# Patient Record
Sex: Female | Born: 1975 | Race: Black or African American | Hispanic: No | Marital: Single | Smoking: Current every day smoker
Health system: Southern US, Community
[De-identification: ages and names within clinical notes are randomized; demographics above are authoritative.]

## PROBLEM LIST (undated history)

## (undated) HISTORY — PX: DENTAL SURGERY: SHX609

---

## 2005-08-26 ENCOUNTER — Ambulatory Visit: Payer: Self-pay | Admitting: Internal Medicine

## 2005-08-26 ENCOUNTER — Ambulatory Visit: Payer: Self-pay | Admitting: *Deleted

## 2005-09-16 ENCOUNTER — Ambulatory Visit: Payer: Self-pay | Admitting: Internal Medicine

## 2010-12-26 ENCOUNTER — Emergency Department (HOSPITAL_COMMUNITY)
Admission: EM | Admit: 2010-12-26 | Discharge: 2010-12-26 | Disposition: A | Discharge status: Home / Self Care | Payer: Self-pay | Attending: Emergency Medicine | Admitting: Emergency Medicine

## 2010-12-26 DIAGNOSIS — IMO0002 Reserved for concepts with insufficient information to code with codable children: Secondary | ICD-10-CM | POA: Insufficient documentation

## 2010-12-26 DIAGNOSIS — H9190 Unspecified hearing loss, unspecified ear: Secondary | ICD-10-CM | POA: Insufficient documentation

## 2013-07-14 DIAGNOSIS — S2249XA Multiple fractures of ribs, unspecified side, initial encounter for closed fracture: Secondary | ICD-10-CM | POA: Insufficient documentation

## 2013-07-26 DIAGNOSIS — S270XXA Traumatic pneumothorax, initial encounter: Secondary | ICD-10-CM | POA: Insufficient documentation

## 2013-07-26 DIAGNOSIS — F17200 Nicotine dependence, unspecified, uncomplicated: Secondary | ICD-10-CM | POA: Insufficient documentation

## 2017-10-24 ENCOUNTER — Other Ambulatory Visit: Payer: Self-pay

## 2017-10-24 ENCOUNTER — Encounter (HOSPITAL_COMMUNITY): Payer: Self-pay | Admitting: Emergency Medicine

## 2017-10-24 ENCOUNTER — Emergency Department (HOSPITAL_COMMUNITY): Payer: Self-pay

## 2017-10-24 ENCOUNTER — Emergency Department (HOSPITAL_COMMUNITY)
Admission: EM | Admit: 2017-10-24 | Discharge: 2017-10-24 | Disposition: A | Discharge status: Home / Self Care | Payer: Self-pay | Attending: Emergency Medicine | Admitting: Emergency Medicine

## 2017-10-24 DIAGNOSIS — M79675 Pain in left toe(s): Secondary | ICD-10-CM | POA: Insufficient documentation

## 2017-10-24 DIAGNOSIS — F17210 Nicotine dependence, cigarettes, uncomplicated: Secondary | ICD-10-CM | POA: Insufficient documentation

## 2017-10-24 MED ORDER — IBUPROFEN 600 MG PO TABS: 600.0000 mg | ORAL_TABLET | Freq: Four times a day (QID) | ORAL | 0 refills | Status: AC | PRN

## 2017-10-24 MED ORDER — IBUPROFEN 800 MG PO TABS
800.0000 mg | ORAL_TABLET | Freq: Once | ORAL | Status: AC
  Administered 2017-10-24: 800 mg via ORAL
  Filled 2017-10-24: qty 1

## 2017-10-24 NOTE — ED Provider Notes (Signed)
[facility] EMERGENCY DEPARTMENT Provider Note   CSN: [id number] Arrival date & time: 10/24/17  2158     History   Chief Complaint Chief Complaint  Patient presents with  . Toe Pain    HPI Savannah Peters is a 42 y.o. female.  HPI   Savannah Peters is a 42 y.o. female who presents to the Emergency Department complaining of left great toe pain and "popping"  Symptoms have been present for one day.  She states that her toe pops when she walks and causes a sharp pain to radiate toward her ankle.  Tonight, she noticed a bruise to her ankle.  She denies known injury.  Admits to wearing sandals recently.  No numbness, ankle pain, discoloration of her toes.     History reviewed. No pertinent past medical history.  There are no active problems to display for this patient.   History reviewed. No pertinent surgical history.   OB History   None      Home Medications    Prior to Admission medications   Not on File    Family History History reviewed. No pertinent family history.  Social History Social History   Tobacco Use  . Smoking status: Current Every Day Smoker    Packs/day: 2.00  . Smokeless tobacco: Never Used  Substance Use Topics  . Alcohol use: Yes    Comment: q week  . Drug use: Never     Allergies   Patient has no known allergies.   Review of Systems Review of Systems  Constitutional: Negative for chills and fever.  Musculoskeletal: Positive for arthralgias (left great toe pain and "popping"). Negative for joint swelling.  Skin: Negative for color change and wound.  Neurological: Negative for weakness and numbness.  All other systems reviewed and are negative.    Physical Exam Updated Vital Signs BP 133/84 (BP Location: Left Arm)   Pulse 76   Temp 98 F (36.7 C) (Tympanic)   Resp 17   Ht 5\' 7"  (1.702 m)   Wt 60.8 kg (134 lb)   LMP 10/17/2017   SpO2 97%   BMI 20.99 kg/m   Physical Exam  Constitutional: She appears well-developed and  well-nourished. No distress.  HENT:  Head: Atraumatic.  Cardiovascular: Normal rate, regular rhythm and intact distal pulses.  Pulmonary/Chest: Effort normal and breath sounds normal.  Musculoskeletal: She exhibits tenderness. She exhibits no edema or deformity.  ttp of the MTP joint of the left great toe.  No edema, erythema or bony deformity.  Nail is nml appearing.  No tenderness proximal to the toe.  No obvious bruise of the ankle.  Neurological: She is alert. No sensory deficit.  Skin: Skin is warm and dry. Capillary refill takes less than 2 seconds. No rash noted.  Nursing note and vitals reviewed.    ED Treatments / Results  Labs (all labs ordered are listed, but only abnormal results are displayed) Labs Reviewed - No data to display  EKG None  Radiology Dg Foot Complete Left  Result Date: 10/24/2017 CLINICAL DATA:  42 year old female with left foot pain. EXAM: LEFT FOOT - COMPLETE 3+ VIEW COMPARISON:  None. FINDINGS: There is no evidence of fracture or dislocation. There is no evidence of arthropathy or other focal bone abnormality. Soft tissues are unremarkable. IMPRESSION: Negative. Electronically Signed   By: Elgie Collard M.D.   On: 10/24/2017 23:22    Procedures Procedures (including critical care time)  Medications Ordered in ED Medications - No data to  display   Initial Impression / Assessment and Plan / ED Course  I have reviewed the triage vital signs and the nursing notes.  Pertinent labs & imaging results that were available during my care of the patient were reviewed by me and considered in my medical decision making (see chart for details).     NV intact.  No skin changes on exam.  XR reassuring.   Pt agrees to ibuprofen, post op shoe applied for support.    Final Clinical Impressions(s) / ED Diagnoses   Final diagnoses:  Pain of left great toe    ED Discharge Orders    None       Rosey Bathriplett, Kipp Shank, PA-C 10/24/17 2350    Benjiman CorePickering,  Nathan, MD 10/24/17 2352

## 2017-10-24 NOTE — ED Triage Notes (Signed)
PT reports she woke up this morning and her L great toe kept "popping" with no injury. States pain has increased throughout the day. Tylenol at home without relief.

## 2017-10-24 NOTE — Discharge Instructions (Signed)
Wear the post op shoe as needed.  Call the podiatry provider listed to arrange a follow-up appt if needed.

## 2019-07-02 IMAGING — CR DG FOOT COMPLETE 3+V*L*
1 series · 3 of 3 positions shown · non-contrast
Comparison: None.

CLINICAL DATA: 41-year-old female with left foot pain.

EXAM:
LEFT FOOT - COMPLETE 3+ VIEW

[Series 1: ap · 0.17mm/px · 3 of 3 slices shown]
[im 1/3]
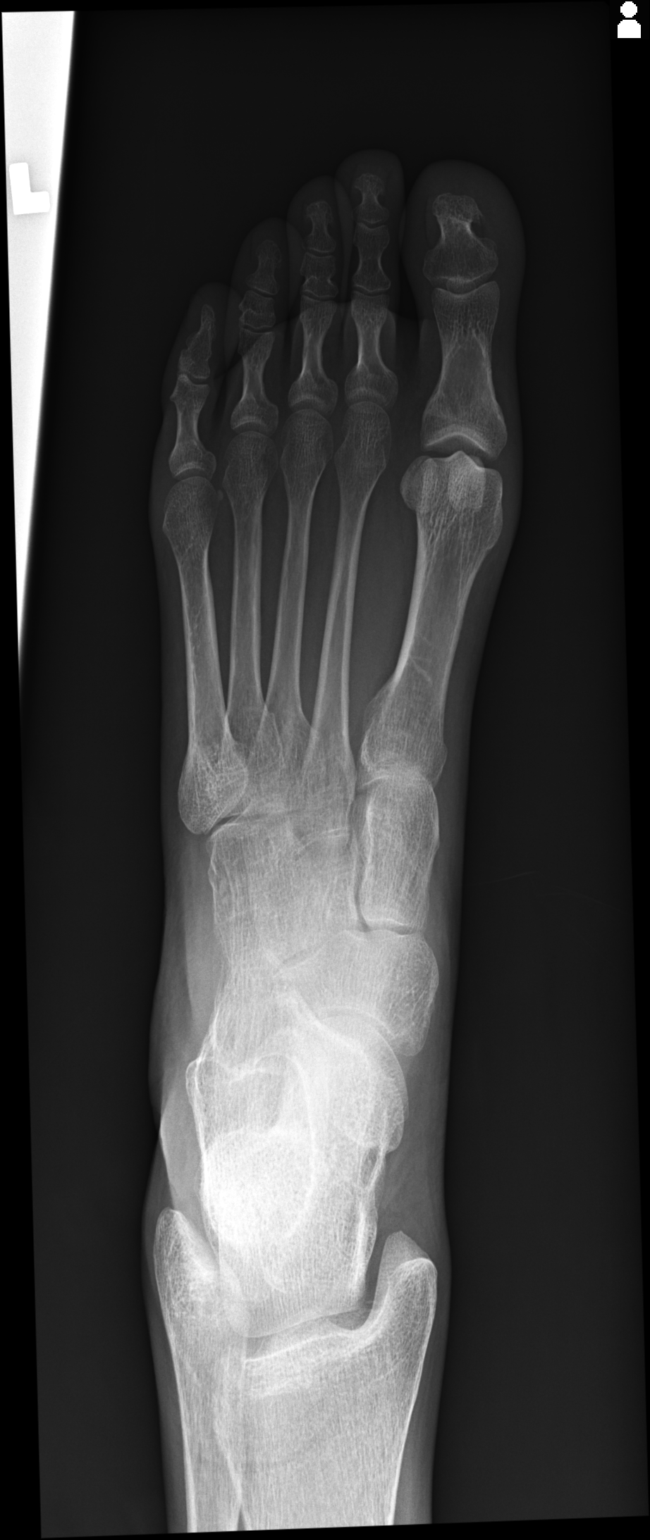
[im 2/3]
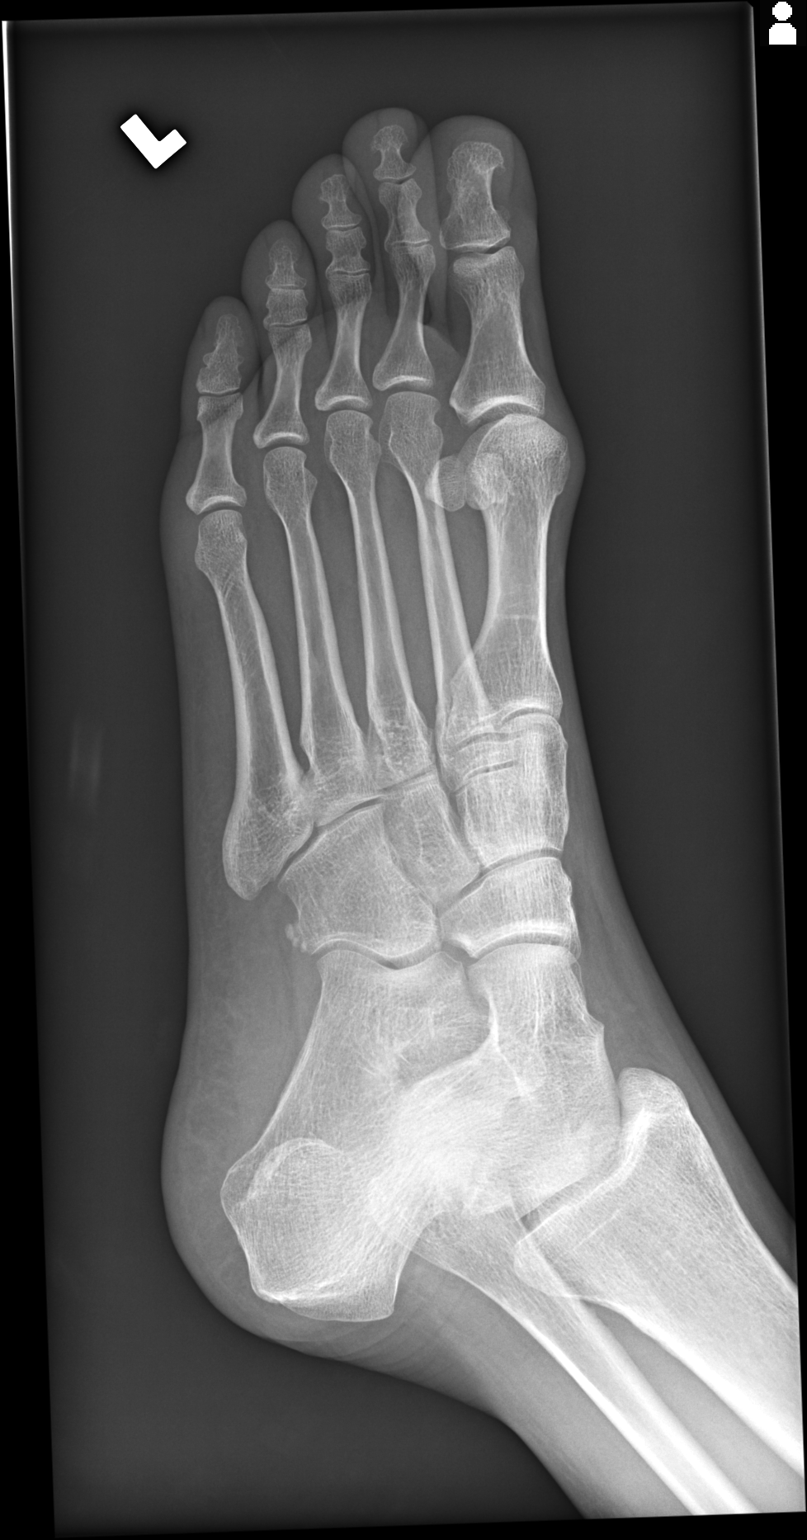
[im 3/3]
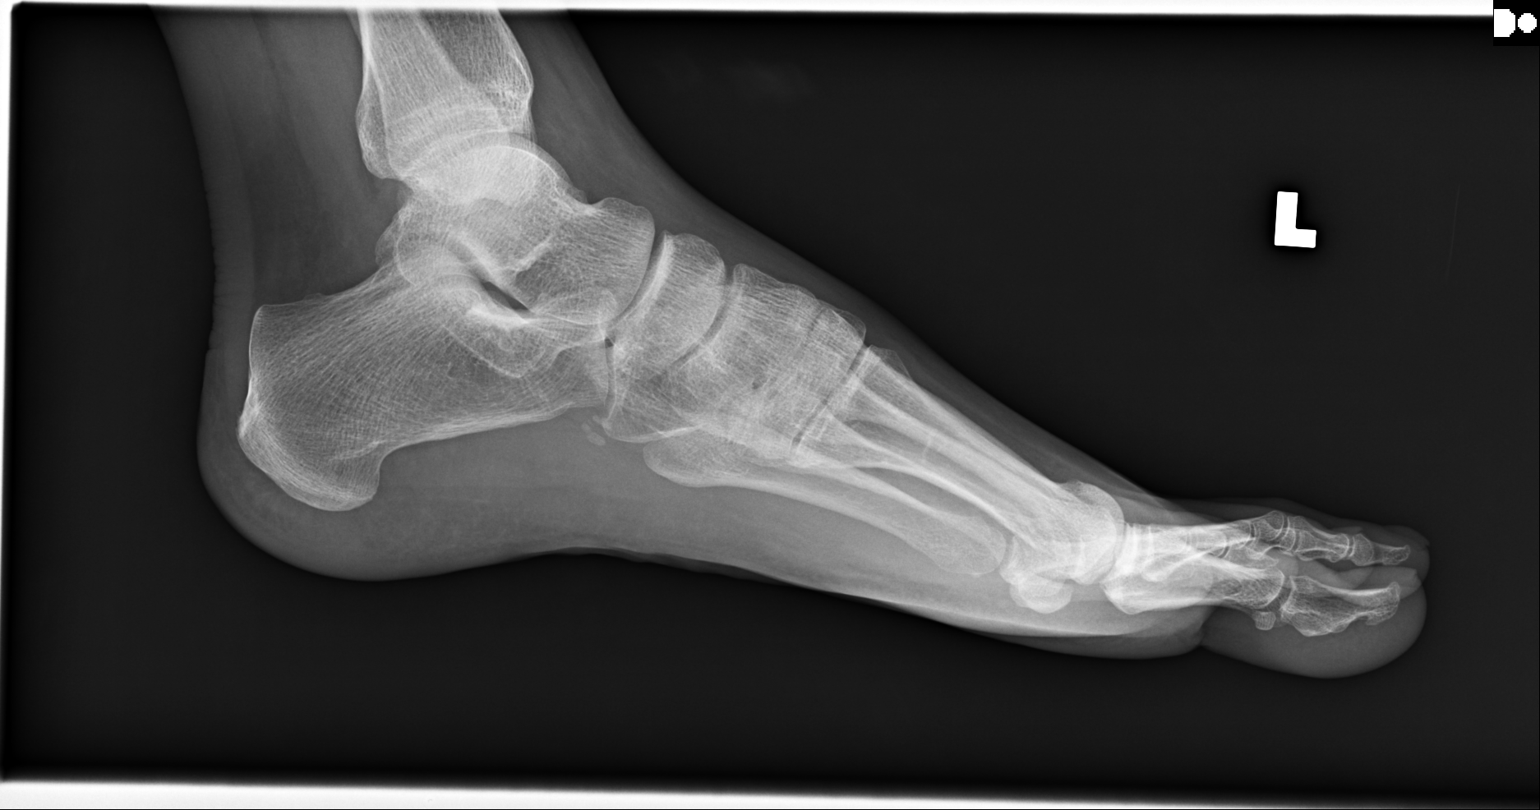

[3 of 3 positions shown; findings below may reference images not displayed]

FINDINGS: There is no evidence of fracture or dislocation. There is no
evidence of arthropathy or other focal bone abnormality. Soft
tissues are unremarkable.
IMPRESSION: Negative.

## 2021-05-11 DIAGNOSIS — G510 Bell's palsy: Secondary | ICD-10-CM | POA: Insufficient documentation

## 2021-05-12 ENCOUNTER — Other Ambulatory Visit: Payer: Self-pay

## 2021-05-12 ENCOUNTER — Encounter (HOSPITAL_COMMUNITY): Payer: Self-pay | Admitting: Emergency Medicine

## 2021-05-12 ENCOUNTER — Emergency Department (HOSPITAL_COMMUNITY)
Admission: EM | Admit: 2021-05-12 | Discharge: 2021-05-12 | Disposition: A | Discharge status: Home / Self Care | Payer: PRIVATE HEALTH INSURANCE | Attending: Emergency Medicine | Admitting: Emergency Medicine

## 2021-05-12 DIAGNOSIS — R519 Headache, unspecified: Secondary | ICD-10-CM | POA: Insufficient documentation

## 2021-05-12 DIAGNOSIS — R29810 Facial weakness: Secondary | ICD-10-CM | POA: Diagnosis present

## 2021-05-12 DIAGNOSIS — M791 Myalgia, unspecified site: Secondary | ICD-10-CM | POA: Insufficient documentation

## 2021-05-12 DIAGNOSIS — H9202 Otalgia, left ear: Secondary | ICD-10-CM | POA: Diagnosis not present

## 2021-05-12 DIAGNOSIS — G510 Bell's palsy: Secondary | ICD-10-CM | POA: Insufficient documentation

## 2021-05-12 MED ORDER — PREDNISONE 50 MG PO TABS
60.0000 mg | ORAL_TABLET | Freq: Once | ORAL | Status: AC
  Administered 2021-05-12: 60 mg via ORAL
  Filled 2021-05-12: qty 1

## 2021-05-12 MED ORDER — VALACYCLOVIR HCL 1 G PO TABS: 1000.0000 mg | ORAL_TABLET | Freq: Three times a day (TID) | ORAL | 0 refills | Status: AC

## 2021-05-12 MED ORDER — PREDNISONE 20 MG PO TABS: 60.0000 mg | ORAL_TABLET | Freq: Every day | ORAL | 0 refills | Status: AC

## 2021-05-12 MED ORDER — VALACYCLOVIR HCL 500 MG PO TABS
1000.0000 mg | ORAL_TABLET | Freq: Once | ORAL | Status: AC
  Administered 2021-05-12: 1000 mg via ORAL
  Filled 2021-05-12: qty 2

## 2021-05-12 NOTE — ED Notes (Signed)
Pt d/c home per MD order. Discharge summary reviewed, pt verbalizes understanding. Ambulatory off unit. No s/s of acute distress noted at discharge. Discharged home with visitor.  

## 2021-05-12 NOTE — ED Triage Notes (Signed)
Patient c/o left side facial drooping that started Saturday upon waking at 10 am. Denies any slurred speech, headaches, dizziness, or neurological deficits. Patient unable to blink left eye. Equal grips and leg strength. EDP made aware.

## 2021-05-12 NOTE — ED Provider Notes (Signed)
[facility] EMERGENCY DEPARTMENT Provider Note   CSN: [id number] Arrival date & time: 05/12/21  1557     History  Chief Complaint  Patient presents with   Facial Droop    Savannah Peters is a 46 y.o. female who presents with her mother at bedside with concern for 48 hours of left-sided facial droop that started Saturday when she woke up around 10 AM.  No history of slurred speech, headaches, blurred or double vision, dizziness, or focal weakness.  Difficulty closing the left eye.  Prior to onset of symptoms patient did experience some headaches and body aches particularly pain around her left ear.  I personally reviewed this patient's medical records.  His history of needing dental surgeries in the past and is an [employer] fundraiser.    Left sided bell's palsy, normal CN exam except 7.   HPI     Home Medications Prior to Admission medications   Medication Sig Start Date End Date Taking? Authorizing Provider  predniSONE (DELTASONE) 20 MG tablet Take 3 tablets (60 mg total) by mouth daily with breakfast for 7 days. 05/12/21 05/19/21 Yes Mikka Kissner, Eugene Gavia, PA-C  valACYclovir (VALTREX) 1000 MG tablet Take 1 tablet (1,000 mg total) by mouth 3 (three) times daily for 7 days. 05/12/21 05/19/21 Yes Tanishi Nault, Lupe Carney R, PA-C  ibuprofen (ADVIL,MOTRIN) 600 MG tablet Take 1 tablet (600 mg total) by mouth every 6 (six) hours as needed. 10/24/17   Triplett, Tammy, PA-C      Allergies    Patient has no known allergies.    Review of Systems   Review of Systems  Constitutional: Negative.   HENT: Negative.    Respiratory: Negative.    Cardiovascular: Negative.   Gastrointestinal: Negative.   Genitourinary: Negative.   Musculoskeletal: Negative.   Neurological:  Positive for facial asymmetry.  Hematological: Negative.   Psychiatric/Behavioral: Negative.     Physical Exam Updated Vital Signs BP (!) 149/94 (BP Location: Right Arm)    Pulse 91    Temp 98.4 F (36.9 C) (Oral)    Resp 16    Ht 5\' 7"   (1.702 m)    Wt 59 kg    LMP 04/28/2021    SpO2 100%    BMI 20.36 kg/m  Physical Exam Vitals and nursing note reviewed.  Constitutional:      Appearance: She is not ill-appearing or toxic-appearing.  HENT:     Head: Atraumatic. No Battle's sign.     Jaw: There is normal jaw occlusion.     Comments: Significant left-sided facial droop that does not spare the eyebrow or forehead.  Patient unable to raise the left side of the mouth with a smile and unable to completely open or close the left eye.  PERRL, EOMI, no concern for corneal injury at this time    Right Ear: Tympanic membrane normal.     Left Ear: Tympanic membrane normal.     Nose: Nose normal.     Mouth/Throat:     Mouth: Mucous membranes are moist.     Pharynx: Oropharynx is clear. Uvula midline. No oropharyngeal exudate or posterior oropharyngeal erythema.  Eyes:     General: Lids are normal. Vision grossly intact. No visual field deficit.       Right eye: No discharge.        Left eye: No discharge.     Extraocular Movements: Extraocular movements intact.     Conjunctiva/sclera: Conjunctivae normal.     Pupils: Pupils are equal, round, and reactive to  light.  Cardiovascular:     Rate and Rhythm: Normal rate and regular rhythm.     Pulses: Normal pulses.  Pulmonary:     Effort: Pulmonary effort is normal. No respiratory distress.     Breath sounds: Normal breath sounds. No wheezing or rales.  Abdominal:     General: Bowel sounds are normal. There is no distension.     Tenderness: There is no abdominal tenderness.  Musculoskeletal:        General: No deformity.     Cervical back: Normal range of motion and neck supple.  Skin:    General: Skin is warm and dry.     Capillary Refill: Capillary refill takes less than 2 seconds.  Neurological:     General: No focal deficit present.     Mental Status: She is alert and oriented to person, place, and time. Mental status is at baseline.     GCS: GCS eye subscore is 4. GCS  verbal subscore is 5. GCS motor subscore is 6.     Cranial Nerves: Facial asymmetry present. No dysarthria.     Sensory: Sensation is intact.     Motor: Motor function is intact.     Coordination: Coordination is intact.     Gait: Gait is intact.     Comments: Left-sided facial droop that does not spare the forehead.  PERRL, EOMI, normal visual fields cranial nerves I through VI intact, cranial nerves VIII through XII intact no gag was not tested.    Psychiatric:        Mood and Affect: Mood normal.    ED Results / Procedures / Treatments   Labs (all labs ordered are listed, but only abnormal results are displayed) Labs Reviewed - No data to display  EKG None  Radiology No results found.  Procedures Procedures    Medications Ordered in ED Medications  valACYclovir (VALTREX) tablet 1,000 mg (1,000 mg Oral Given 05/12/21 1719)  predniSONE (DELTASONE) tablet 60 mg (60 mg Oral Given 05/12/21 1719)    ED Course/ Medical Decision Making/ A&P Clinical Course as of 05/14/21 [id number]  Wynelle LinkSun May 12, 2021  [id number] 46 year old female here with left facial droop that started yesterday.  Had had some preceding symptoms of left ear pain and left eye twitching the day before.  No other numbness or weakness.  Clinically has Bell's palsy. [MB]    Clinical Course User Index [MB] Terrilee FilesButler, Michael C, MD                           Medical Decision Making 46 year old female without history for increased risk of CVA who presents with concern for left-sided facial droop x48 hours after brief viral illness type symptoms.  Her vital signs were normal on intake.  Cardiopulmonary exam is normal, abdominal exam is benign.  Differential diagnosis includes but is not limited to CVA, intracranial mass or hemorrhage, peripheral facial palsy such as Bell's palsy, psychogenic etiology.  Patient is neurovascular intact in all 4 extremities and her neurologic exam is as above with deficit only in cranial nerve VII,  affecting the left side of the forehead in addition to the rest of the left side of the face.  Problems Addressed: Bell's palsy: acute illness or injury    Details: Given severity of left-sided facial droop will treat with both steroid and antiviral with valacyclovir  Risk Prescription drug management.  Patient with acute Bell's palsy with otherwise reassuring neurologic exam  aside from facial droop.  Recommend artificial tears in the left eye during the day and ointment in the evening to keep it moist.  Will discharge with prescription for steroid and valacyclovir.  Recommendation for outpatient follow-up with PCP and neurology.  Reyanna voiced understanding of her medical evaluation and treatment plan.  Each of her questions was answered to her expressed satisfaction.  Return precautions were given.  Patient was well-appearing, stable, and was discharged in good condition      This chart was dictated using voice recognition software, Dragon. Despite the best efforts of this provider to proofread and correct errors, errors may still occur which can change documentation meaning.     Final Clinical Impression(s) / ED Diagnoses Final diagnoses:  Bell's palsy    Rx / DC Orders ED Discharge Orders          Ordered    valACYclovir (VALTREX) 1000 MG tablet  3 times daily        05/12/21 1714    predniSONE (DELTASONE) 20 MG tablet  Daily with breakfast        05/12/21 1714              Ngina Royer, Eugene Gavia, PA-C 05/14/21 0952    Terrilee Files, MD 05/14/21 1110

## 2021-05-12 NOTE — Discharge Instructions (Addendum)
You are seen in the ER today for your facial droop.  Your physical exam and vital signs were very reassuring.  You have been diagnosed with Bell's palsy which is a peripheral nerve issue and not consistent with stroke.  You have been prescribed 2 medications.  1 is a steroid please take daily for the next 7 days and the other is a antiviral medication to take 3 times a day for the next 7 days.  Please take them as prescribed for the entire course.  Please follow-up with the neurologist listed below, please also contact your eye doctor for continued evaluation of your cornea.  Please use eyedrops daily to keep your eye moist and ointment in the evenings.  Return to ER if you develop any new numbness, ting, weakness, vision changes, or any other severe symptom.

## 2021-05-16 ENCOUNTER — Encounter: Payer: Self-pay | Admitting: Nurse Practitioner

## 2021-05-16 ENCOUNTER — Other Ambulatory Visit: Payer: Self-pay

## 2021-05-16 ENCOUNTER — Ambulatory Visit (INDEPENDENT_AMBULATORY_CARE_PROVIDER_SITE_OTHER): Payer: PRIVATE HEALTH INSURANCE | Admitting: Nurse Practitioner

## 2021-05-16 VITALS — BP 133/75 | HR 77 | Temp 98.6°F | Ht 67.0 in | Wt 132.1 lb

## 2021-05-16 DIAGNOSIS — F439 Reaction to severe stress, unspecified: Secondary | ICD-10-CM

## 2021-05-16 DIAGNOSIS — Q670 Congenital facial asymmetry: Secondary | ICD-10-CM | POA: Diagnosis not present

## 2021-05-16 DIAGNOSIS — Z7689 Persons encountering health services in other specified circumstances: Secondary | ICD-10-CM

## 2021-05-16 DIAGNOSIS — G510 Bell's palsy: Secondary | ICD-10-CM

## 2021-05-16 DIAGNOSIS — R5383 Other fatigue: Secondary | ICD-10-CM

## 2021-05-16 DIAGNOSIS — E559 Vitamin D deficiency, unspecified: Secondary | ICD-10-CM

## 2021-05-16 DIAGNOSIS — F4321 Adjustment disorder with depressed mood: Secondary | ICD-10-CM

## 2021-05-16 DIAGNOSIS — Z0000 Encounter for general adult medical examination without abnormal findings: Secondary | ICD-10-CM

## 2021-05-16 DIAGNOSIS — E569 Vitamin deficiency, unspecified: Secondary | ICD-10-CM

## 2021-05-16 NOTE — Progress Notes (Signed)
New Patient Office Visit  Subjective:  Patient ID: Savannah Peters, female    DOB: 26-Apr-1976  Age: 46 y.o. MRN: [id number]  CC:  Chief Complaint  Patient presents with   New Patient (Initial Visit)    HPI Savannah Peters presents to establish new primary care provider. She has not had priary doctor since 2015. She was seen in ER on 05/13/2021. She woke up with left sided weakness. She did not have confusion. She did not have weakness on the let side. She has no slurred speech. She was diagnosed with Bell's Palsy. She was given 7 days of anti virals and a prednisone taper. They did refer her to neurology, however, she can't go because she does not have a primary care provider.  She has had no improvement in symptoms since starting on treatment. When reviewing the ER visit, she did not have labs or imaging done.  The patient does need to have routine labs.  She should have routine CPE with pap.   History reviewed. No pertinent past medical history.  Past Surgical History:  Procedure Laterality Date   DENTAL SURGERY      Family History  Problem Relation Age of Onset   Diabetes Mother    High blood pressure Mother    Stroke Father    Cancer Father    Alcoholism Father    Diabetes Maternal Grandmother     Social History   Socioeconomic History   Marital status: Single    Spouse name: Not on file   Number of children: Not on file   Years of education: Not on file   Highest education level: Not on file  Occupational History   Not on file  Tobacco Use   Smoking status: Every Day    Packs/day: 2.00    Types: Cigarettes   Smokeless tobacco: Never  Vaping Use   Vaping Use: Never used  Substance and Sexual Activity   Alcohol use: Yes    Comment: q week   Drug use: Never   Sexual activity: Not on file  Other Topics Concern   Not on file  Social History Narrative   Not on file   Social Determinants of Health   Financial Resource Strain: Not on file  Food Insecurity: Not  on file  Transportation Needs: Not on file  Physical Activity: Not on file  Stress: Not on file  Social Connections: Not on file  Intimate Partner Violence: Not on file    ROS Review of Systems  Constitutional:  Negative for activity change, appetite change, chills, fatigue and fever.  HENT:  Positive for facial swelling. Negative for congestion, postnasal drip, rhinorrhea, sinus pressure, sinus pain, sneezing and sore throat.        Facial asymmetry with weakness on the left side.  Eyes: Negative.   Respiratory:  Negative for cough, chest tightness, shortness of breath and wheezing.   Cardiovascular:  Negative for chest pain and palpitations.  Gastrointestinal:  Negative for abdominal pain, constipation, diarrhea, nausea and vomiting.  Endocrine: Negative for cold intolerance, heat intolerance, polydipsia and polyuria.  Genitourinary:  Negative for dyspareunia, dysuria, flank pain, frequency and urgency.  Musculoskeletal:  Negative for arthralgias, back pain and myalgias.  Skin:  Negative for rash.  Allergic/Immunologic: Negative for environmental allergies.  Neurological:  Positive for facial asymmetry. Negative for dizziness, weakness and headaches.  Hematological:  Negative for adenopathy.  Psychiatric/Behavioral:  The patient is not nervous/anxious.    Objective:   Today's Vitals: BP 133/75  Pulse 77    Temp 98.6 F (37 C)    Ht 5' 7"  (1.702 m)    Wt 132 lb 1.6 oz (59.9 kg)    LMP 04/28/2021    SpO2 100%    BMI 20.69 kg/m   Physical Exam Vitals and nursing note reviewed.  Constitutional:      Appearance: Normal appearance. She is well-developed.  HENT:     Head: Normocephalic and atraumatic.     Right Ear: Tympanic membrane, ear canal and external ear normal.     Left Ear: Tympanic membrane, ear canal and external ear normal.     Nose: Nose normal.     Mouth/Throat:     Mouth: Mucous membranes are moist.     Pharynx: Oropharynx is clear.  Eyes:     Pupils: Pupils  are equal, round, and reactive to light.  Cardiovascular:     Rate and Rhythm: Normal rate and regular rhythm.     Pulses: Normal pulses.     Heart sounds: Normal heart sounds.  Pulmonary:     Effort: Pulmonary effort is normal.     Breath sounds: Normal breath sounds.  Abdominal:     Palpations: Abdomen is soft.  Musculoskeletal:        General: Normal range of motion.     Cervical back: Normal range of motion and neck supple.  Lymphadenopathy:     Cervical: No cervical adenopathy.  Skin:    General: Skin is warm and dry.     Capillary Refill: Capillary refill takes less than 2 seconds.  Neurological:     General: No focal deficit present.     Mental Status: She is alert and oriented to person, place, and time.     Motor: Weakness present.     Comments: Facial weakness which incorporates the left eyebrow, the left cheek, left side of the mouth.  Facial movements asymmetrical. Pupils are equal round and reactive to light and accommodation bilaterally.  Psychiatric:        Mood and Affect: Mood normal.        Behavior: Behavior normal.        Thought Content: Thought content normal.        Judgment: Judgment normal.    Assessment & Plan:  1. Encounter to establish care Appointment today to establish new primary care provider. Patient has not had primary care in some time.   2. Facial asymmetries Likely caused by Bell's palsy.  Patient has completed antiviral therapy and steroid taper.  She has noted little change. Will get MRI of the brain for further evaluation. A referral to neurology was made today.  - MR Brain Wo Contrast; Future - Ambulatory referral to Neurology  3. Bell's palsy Patient has noted little to no improvement in symptoms after treatment with antiviral and steroid taper. Will get MRI of the brain. Refer to neurology for further evaluation.  - MR Brain Wo Contrast; Future - Ambulatory referral to Neurology  4. Situational stress Patient states that she is  having situational stress and would like to speak to a counselor. A referral to psychology was made today.  - Ambulatory referral to Psychology  5. Grief reaction Grief reaction to recent death in the family. A referral to psychology was made today.  - Ambulatory referral to Psychology  6. Vitamin D deficiency Check vitamin D with routine fasting labs  - Vitamin D (25 hydroxy); Future - Vitamin D (25 hydroxy)  7. Other fatigue Check TSH  and Free T4.  - T4, free; Future - T4, free  8. Health care maintenance Routine, fasting labs drawn during today's visit.  - CBC; Future - Comp Met (CMET); Future - Lipid panel; Future - TSH; Future - Hemoglobin A1c; Future - Hemoglobin A1c - TSH - Lipid panel - Comp Met (CMET) - CBC    Problem List Items Addressed This Visit       Nervous and Auditory   Bell's palsy   Relevant Orders   MR Brain Wo Contrast   Ambulatory referral to Neurology     Musculoskeletal and Integument   Facial asymmetries   Relevant Orders   MR Brain Wo Contrast   Ambulatory referral to Neurology     Other   Situational stress   Relevant Orders   Ambulatory referral to Psychology   Grief reaction   Relevant Orders   Ambulatory referral to Psychology   Vitamin D deficiency   Relevant Orders   Vitamin D (25 hydroxy) (Completed)   Other fatigue   Relevant Orders   T4, free (Completed)   Other Visit Diagnoses     Encounter to establish care    -  Primary   Health care maintenance       Relevant Orders   CBC (Completed)   Comp Met (CMET) (Completed)   Lipid panel (Completed)   TSH (Completed)   Hemoglobin A1c (Completed)       Outpatient Encounter Medications as of 05/16/2021  Medication Sig   ibuprofen (ADVIL,MOTRIN) 600 MG tablet Take 1 tablet (600 mg total) by mouth every 6 (six) hours as needed.   predniSONE (DELTASONE) 20 MG tablet Take 3 tablets (60 mg total) by mouth daily with breakfast for 7 days.   valACYclovir (VALTREX) 1000 MG  tablet Take 1 tablet (1,000 mg total) by mouth 3 (three) times daily for 7 days.   No facility-administered encounter medications on file as of 05/16/2021.    Follow-up: Return in about 6 weeks (around 06/27/2021) for health maintenance exam, with pap.   Ronnell Freshwater, NP

## 2021-05-17 DIAGNOSIS — R5383 Other fatigue: Secondary | ICD-10-CM | POA: Insufficient documentation

## 2021-05-17 DIAGNOSIS — F419 Anxiety disorder, unspecified: Secondary | ICD-10-CM | POA: Insufficient documentation

## 2021-05-17 DIAGNOSIS — Q670 Congenital facial asymmetry: Secondary | ICD-10-CM | POA: Insufficient documentation

## 2021-05-17 DIAGNOSIS — F4321 Adjustment disorder with depressed mood: Secondary | ICD-10-CM | POA: Insufficient documentation

## 2021-05-17 DIAGNOSIS — G510 Bell's palsy: Secondary | ICD-10-CM | POA: Insufficient documentation

## 2021-05-17 DIAGNOSIS — F439 Reaction to severe stress, unspecified: Secondary | ICD-10-CM | POA: Insufficient documentation

## 2021-05-17 DIAGNOSIS — E559 Vitamin D deficiency, unspecified: Secondary | ICD-10-CM | POA: Insufficient documentation

## 2021-05-17 LAB — CBC
Hematocrit: 38.8 % (ref 34.0–46.6)
Hemoglobin: 12.7 g/dL (ref 11.1–15.9)
MCH: 28.8 pg (ref 26.6–33.0)
MCHC: 32.7 g/dL (ref 31.5–35.7)
MCV: 88 fL (ref 79–97)
Platelets: 422 10*3/uL (ref 150–450)
RBC: 4.41 x10E6/uL (ref 3.77–5.28)
RDW: 13.1 % (ref 11.7–15.4)
WBC: 12.1 10*3/uL — ABNORMAL HIGH (ref 3.4–10.8)

## 2021-05-17 LAB — COMPREHENSIVE METABOLIC PANEL
ALT: 12 IU/L (ref 0–32)
AST: 14 IU/L (ref 0–40)
Albumin/Globulin Ratio: 2.5 — ABNORMAL HIGH (ref 1.2–2.2)
Albumin: 4.5 g/dL (ref 3.8–4.8)
Alkaline Phosphatase: 48 IU/L (ref 44–121)
BUN/Creatinine Ratio: 14 (ref 9–23)
BUN: 12 mg/dL (ref 6–24)
Bilirubin Total: <0.2 mg/dL (ref 0.0–1.2)
CO2: 22 mmol/L (ref 20–29)
Calcium: 9.8 mg/dL (ref 8.7–10.2)
Chloride: 104 mmol/L (ref 96–106)
Creatinine, Ser: 0.83 mg/dL (ref 0.57–1.00)
Globulin, Total: 1.8 g/dL (ref 1.5–4.5)
Glucose: 92 mg/dL (ref 70–99)
Potassium: 4 mmol/L (ref 3.5–5.2)
Sodium: 142 mmol/L (ref 134–144)
Total Protein: 6.3 g/dL (ref 6.0–8.5)
eGFR: 90 mL/min/{1.73_m2} (ref >59)

## 2021-05-17 LAB — LIPID PANEL
Chol/HDL Ratio: 2.1 ratio (ref 0.0–4.4)
Cholesterol, Total: 143 mg/dL (ref 100–199)
HDL: 68 mg/dL (ref >39)
LDL Chol Calc (NIH): 63 mg/dL (ref 0–99)
Triglycerides: 55 mg/dL (ref 0–149)
VLDL Cholesterol Cal: 12 mg/dL (ref 5–40)

## 2021-05-17 LAB — HEMOGLOBIN A1C
Est. average glucose Bld gHb Est-mCnc: 100 mg/dL
Hgb A1c MFr Bld: 5.1 % (ref 4.8–5.6)

## 2021-05-17 LAB — T4, FREE: Free T4: 1.33 ng/dL (ref 0.82–1.77)

## 2021-05-17 LAB — VITAMIN D 25 HYDROXY (VIT D DEFICIENCY, FRACTURES): Vit D, 25-Hydroxy: 27.6 ng/mL — ABNORMAL LOW (ref 30.0–100.0)

## 2021-05-17 LAB — TSH: TSH: 1.54 u[IU]/mL (ref 0.450–4.500)

## 2021-05-27 ENCOUNTER — Telehealth: Payer: Self-pay | Admitting: Nurse Practitioner

## 2021-05-27 NOTE — Telephone Encounter (Signed)
[facility] called and asked for you to give them a call back on the patient and an authorization regarding her MRI. The number is [phone] and her name is Savannah Peters.

## 2021-05-28 ENCOUNTER — Other Ambulatory Visit: Payer: Self-pay | Admitting: Nurse Practitioner

## 2021-05-28 DIAGNOSIS — D72829 Elevated white blood cell count, unspecified: Secondary | ICD-10-CM

## 2021-05-28 DIAGNOSIS — E559 Vitamin D deficiency, unspecified: Secondary | ICD-10-CM

## 2021-05-28 MED ORDER — ERGOCALCIFEROL 1.25 MG (50000 UT) PO CAPS: 50000.0000 [IU] | ORAL_CAPSULE | ORAL | 5 refills | Status: DC

## 2021-05-28 MED ORDER — AMOXICILLIN 875 MG PO TABS: 875.0000 mg | ORAL_TABLET | Freq: Two times a day (BID) | ORAL | 0 refills | Status: DC

## 2021-05-28 NOTE — Progress Notes (Signed)
Reviewed labs. Mild elevatin of white blood cell count. Added amoxicillin 875mg  twice daily for next 10 days. Vitamin d low. Drisdol iu weekly for next several months. Other labs good.

## 2021-05-28 NOTE — Progress Notes (Signed)
Please let the patient know that I have reviewed  her labs. There is a mild elevatin of white blood cell count. This usually means there is a mild infection in the body. Added amoxicillin 875mg  twice daily for next 10 days. We can recheck this level at her next visit. Also, her vitamin d was low. I would like her to take Drisdol iu weekly for next several months. Other labs good.  Thanks so much.   -HB

## 2021-05-28 NOTE — Telephone Encounter (Signed)
This PA had previously been worked and I was advised patient did not require PA. I have advised Morrie Sheldon with [facility]. AS, CMA

## 2021-05-29 ENCOUNTER — Ambulatory Visit
Admission: RE | Admit: 2021-05-29 | Discharge: 2021-05-29 | Disposition: A | Discharge status: Home / Self Care | Payer: PRIVATE HEALTH INSURANCE | Source: Ambulatory Visit | Attending: Nurse Practitioner | Admitting: Nurse Practitioner

## 2021-05-29 DIAGNOSIS — Q670 Congenital facial asymmetry: Secondary | ICD-10-CM

## 2021-05-29 DIAGNOSIS — G510 Bell's palsy: Secondary | ICD-10-CM

## 2021-05-29 NOTE — Telephone Encounter (Signed)
Patient is aware and has appointment scheduled for today.

## 2021-05-31 NOTE — Progress Notes (Signed)
Normal appearing MRI of the brain. Discuss with patient at CPE 06/19/2021.

## 2021-06-10 ENCOUNTER — Ambulatory Visit (INDEPENDENT_AMBULATORY_CARE_PROVIDER_SITE_OTHER): Payer: PRIVATE HEALTH INSURANCE | Admitting: Neurology

## 2021-06-10 ENCOUNTER — Encounter: Payer: Self-pay | Admitting: Neurology

## 2021-06-10 VITALS — BP 109/62 | HR 65 | Ht 67.0 in | Wt 133.0 lb

## 2021-06-10 DIAGNOSIS — G510 Bell's palsy: Secondary | ICD-10-CM

## 2021-06-10 NOTE — Patient Instructions (Signed)
Use eye patch or tape the left eye during sleep  Follow up with your PMD  Return if worse

## 2021-06-10 NOTE — Progress Notes (Signed)
[facility]  PATIENT: Savannah Peters DOB: Oct 27, 1975  REQUESTING CLINICIAN: Carlean Jews, NP HISTORY FROM: Patient  REASON FOR VISIT: left face weakness    HISTORICAL  CHIEF COMPLAINT:  Chief Complaint  Patient presents with   New Patient (Initial Visit)    Rm 12. Alone. NP Internal referral for Facial paralysis - Bells palsy suspected.    HISTORY OF PRESENT ILLNESS:  This is a 46 year old woman with no reported past medical history who is presenting with left facial weakness.  Patient reports last month, on January 15 she presented to the ED due to left facial weakness.  She reports a day or 2 before she did have some left ear fullness and some funny sensation on the left side of her tongue, did not think anything of it and wake up the day where she noted left facial weakness, inability to fully smile and inability to close her left eye.  She presented to the ED the next day, diagnosed with Bell's palsy and started on valacyclovir and prednisone for 1 week. Patient completed the course of therapy, and reports minimal improvement of her symptoms.  No other complaint.  She is not using a eye patch at night..      OTHER MEDICAL CONDITIONS: None reported    REVIEW OF SYSTEMS: Full 14 system review of systems performed and negative with exception of: As noted in the HPI   ALLERGIES: No Known Allergies  HOME MEDICATIONS: Outpatient Medications Prior to Visit  Medication Sig Dispense Refill   amoxicillin (AMOXIL) 875 MG tablet Take 1 tablet (875 mg total) by mouth 2 (two) times daily. 20 tablet 0   ergocalciferol (DRISDOL) 1.25 MG (50000 UT) capsule Take 1 capsule (50,000 Units total) by mouth once a week. 4 capsule 5   ibuprofen (ADVIL,MOTRIN) 600 MG tablet Take 1 tablet (600 mg total) by mouth every 6 (six) hours as needed. 30 tablet 0   No facility-administered medications prior to visit.    PAST MEDICAL HISTORY: History reviewed. No pertinent past  medical history.  PAST SURGICAL HISTORY: Past Surgical History:  Procedure Laterality Date   DENTAL SURGERY      FAMILY HISTORY: Family History  Problem Relation Age of Onset   Diabetes Mother    High blood pressure Mother    Stroke Father    Cancer Father    Alcoholism Father    Diabetes Maternal Grandmother     SOCIAL HISTORY: Social History   Socioeconomic History   Marital status: Single    Spouse name: Not on file   Number of children: Not on file   Years of education: Not on file   Highest education level: Not on file  Occupational History   Not on file  Tobacco Use   Smoking status: Every Day    Packs/day: 2.00    Types: Cigarettes   Smokeless tobacco: Never  Vaping Use   Vaping Use: Never used  Substance and Sexual Activity   Alcohol use: Yes    Comment: q week   Drug use: Never   Sexual activity: Not on file  Other Topics Concern   Not on file  Social History Narrative   Not on file   Social Determinants of Health   Financial Resource Strain: Not on file  Food Insecurity: Not on file  Transportation Needs: Not on file  Physical Activity: Not on file  Stress: Not on file  Social Connections: Not on file  Intimate Partner Violence: Not on  file    PHYSICAL EXAM  GENERAL EXAM/CONSTITUTIONAL: Vitals:  Vitals:   06/10/21 0835  BP: 109/62  Pulse: 65  Weight: 133 lb (60.3 kg)  Height: 5\' 7"  (1.702 m)   Body mass index is 20.83 kg/m. Wt Readings from Last 3 Encounters:  06/10/21 133 lb (60.3 kg)  05/16/21 132 lb 1.6 oz (59.9 kg)  05/12/21 130 lb (59 kg)   Patient is in no distress; well developed, nourished and groomed; neck is supple  EYES: Pupils round and reactive to light, Visual fields full to confrontation, Extraocular movements intacts,   MUSCULOSKELETAL: Gait, strength, tone, movements noted in Neurologic exam below  NEUROLOGIC: MENTAL STATUS:  No flowsheet data found. awake, alert, oriented to person, place and  time recent and remote memory intact normal attention and concentration language fluent, comprehension intact, naming intact fund of knowledge appropriate  CRANIAL NERVE: 2nd, 3rd, 4th, 6th - pupils equal and reactive to light, visual fields full to confrontation, extraocular muscles intact, no nystagmus 5th - facial sensation symmetric 7th - There is left facial weakness, left facial droop, inability to fully couple left eyelid, inability to fully smile, inability to puff cheeks  8th - hearing intact 9th - palate elevates symmetrically, uvula midline 11th - shoulder shrug symmetric 12th - tongue protrusion midline  MOTOR:  normal bulk and tone, full strength in the BUE, BLE  SENSORY:  normal and symmetric to light touch  COORDINATION:  finger-nose-finger, fine finger movements normal  GAIT/STATION:  normal     DIAGNOSTIC DATA (LABS, IMAGING, TESTING) - I reviewed patient records, labs, notes, testing and imaging myself where available.  Lab Results  Component Value Date   WBC 12.1 (H) 05/16/2021   HGB 12.7 05/16/2021   HCT 38.8 05/16/2021   MCV 88 05/16/2021   PLT 422 05/16/2021      Component Value Date/Time   NA 142 05/16/2021 1015   K 4.0 05/16/2021 1015   CL 104 05/16/2021 1015   CO2 22 05/16/2021 1015   GLUCOSE 92 05/16/2021 1015   BUN 12 05/16/2021 1015   CREATININE 0.83 05/16/2021 1015   CALCIUM 9.8 05/16/2021 1015   PROT 6.3 05/16/2021 1015   ALBUMIN 4.5 05/16/2021 1015   AST 14 05/16/2021 1015   ALT 12 05/16/2021 1015   ALKPHOS 48 05/16/2021 1015   BILITOT <0.2 05/16/2021 1015   Lab Results  Component Value Date   CHOL 143 05/16/2021   HDL 68 05/16/2021   LDLCALC 63 05/16/2021   TRIG 55 05/16/2021   CHOLHDL 2.1 05/16/2021   Lab Results  Component Value Date   HGBA1C 5.1 05/16/2021   No results found for: VITAMINB12 Lab Results  Component Value Date   TSH 1.540 05/16/2021    MRI Brain without contrast 05/30/2021  Unremarkable brain  MRI.     ASSESSMENT AND PLAN  46 y.o. year old female with no reported medical history who is presenting with 1 month history of left facial weakness consistent with Bell's palsy.  She was started on 1 week course of valacyclovir and steroid and reports minimal improvement of her symptoms.  She continues to do facial exercises, but does not use a eye patch at night.  I informed the patient that her symptoms are consistent with Bell's palsy, her MRI brain was unremarkable and symptoms should resolve within 3 to 34-months.  Recommended her to use eye patch or tape her eye during sleep and to return if worse or if symptoms resolve.  Follow-up with  your primary care doctor as indicated.   1. Bell's palsy      Patient Instructions  Use eye patch or tape the left eye during sleep  Follow up with your PMD  Return if worse   No orders of the defined types were placed in this encounter.   No orders of the defined types were placed in this encounter.   Return if symptoms worsen or fail to improve.    Windell Norfolk, MD 06/10/2021, 9:04 AM  [facility] [address] [place], [place] [address] [phone]

## 2021-06-27 ENCOUNTER — Encounter: Payer: Self-pay | Admitting: Nurse Practitioner

## 2021-06-27 ENCOUNTER — Other Ambulatory Visit: Payer: Self-pay

## 2021-06-27 ENCOUNTER — Other Ambulatory Visit (HOSPITAL_COMMUNITY)
Admission: RE | Admit: 2021-06-27 | Discharge: 2021-06-27 | Disposition: A | Discharge status: Home / Self Care | Payer: PRIVATE HEALTH INSURANCE | Source: Ambulatory Visit | Attending: Nurse Practitioner | Admitting: Nurse Practitioner

## 2021-06-27 ENCOUNTER — Ambulatory Visit (INDEPENDENT_AMBULATORY_CARE_PROVIDER_SITE_OTHER): Payer: PRIVATE HEALTH INSURANCE | Admitting: Nurse Practitioner

## 2021-06-27 VITALS — BP 117/64 | HR 60 | Temp 98.7°F | Ht 67.0 in | Wt 132.4 lb

## 2021-06-27 DIAGNOSIS — Z01419 Encounter for gynecological examination (general) (routine) without abnormal findings: Secondary | ICD-10-CM | POA: Insufficient documentation

## 2021-06-27 DIAGNOSIS — E559 Vitamin D deficiency, unspecified: Secondary | ICD-10-CM

## 2021-06-27 DIAGNOSIS — G510 Bell's palsy: Secondary | ICD-10-CM

## 2021-06-27 DIAGNOSIS — D72829 Elevated white blood cell count, unspecified: Secondary | ICD-10-CM

## 2021-06-27 DIAGNOSIS — Z1231 Encounter for screening mammogram for malignant neoplasm of breast: Secondary | ICD-10-CM

## 2021-06-27 NOTE — Progress Notes (Signed)
Established patient visit   Patient: Savannah Peters   DOB: 07/26/1975   46 y.o. Female  MRN: [id number] Visit Date: 06/27/2021   Chief Complaint  Patient presents with   Annual Exam   Gynecologic Exam   Subjective    HPI she  The patient presents for annual wellness visit. She continues to have left sided facial weakness. She did see neurology on 06/10/2021 who verified diagnosis of Bell' palsy.  She was told that should resolve within 3 to 6 months.  Brain MRI done 05/30/2021 was unremarkable. Routine fasting labs done prior to this visit. -elevated WBC -decreased vitamin D No new concerns or complaints today.    Medications: Outpatient Medications Prior to Visit  Medication Sig   ergocalciferol (DRISDOL) 1.25 MG (50000 UT) capsule Take 1 capsule (50,000 Units total) by mouth once a week.   ibuprofen (ADVIL,MOTRIN) 600 MG tablet Take 1 tablet (600 mg total) by mouth every 6 (six) hours as needed.   [DISCONTINUED] amoxicillin (AMOXIL) 875 MG tablet Take 1 tablet (875 mg total) by mouth 2 (two) times daily.   No facility-administered medications prior to visit.    Review of Systems  Constitutional:  Negative for activity change, appetite change, chills, fatigue and fever.  HENT:  Negative for congestion, postnasal drip, rhinorrhea, sinus pressure, sinus pain, sneezing and sore throat.   Eyes: Negative.   Respiratory:  Negative for cough, chest tightness, shortness of breath and wheezing.   Cardiovascular:  Negative for chest pain and palpitations.  Gastrointestinal:  Negative for abdominal pain, constipation, diarrhea, nausea and vomiting.  Endocrine: Negative for cold intolerance, heat intolerance, polydipsia and polyuria.  Genitourinary:  Negative for dyspareunia, dysuria, flank pain, frequency and urgency.  Musculoskeletal:  Negative for arthralgias, back pain and myalgias.  Skin:  Negative for rash.  Allergic/Immunologic: Negative for environmental allergies.  Neurological:   Negative for dizziness, weakness and headaches.       Facial asymmetry with weakness on the left side.   Hematological:  Negative for adenopathy.  Psychiatric/Behavioral:  The patient is not nervous/anxious.    Last CBC Lab Results  Component Value Date   WBC 10.0 06/27/2021   HGB 12.7 06/27/2021   HCT 38.3 06/27/2021   MCV 86 06/27/2021   MCH 28.5 06/27/2021   RDW 12.8 06/27/2021   PLT 455 (H) 35/32/9924   Last metabolic panel Lab Results  Component Value Date   GLUCOSE 92 05/16/2021   NA 142 05/16/2021   K 4.0 05/16/2021   CL 104 05/16/2021   CO2 22 05/16/2021   BUN 12 05/16/2021   CREATININE 0.83 05/16/2021   EGFR 90 05/16/2021   CALCIUM 9.8 05/16/2021   PROT 6.3 05/16/2021   ALBUMIN 4.5 05/16/2021   LABGLOB 1.8 05/16/2021   AGRATIO 2.5 (H) 05/16/2021   BILITOT <0.2 05/16/2021   ALKPHOS 48 05/16/2021   AST 14 05/16/2021   ALT 12 05/16/2021   Last lipids Lab Results  Component Value Date   CHOL 143 05/16/2021   HDL 68 05/16/2021   LDLCALC 63 05/16/2021   TRIG 55 05/16/2021   CHOLHDL 2.1 05/16/2021   Last hemoglobin A1c Lab Results  Component Value Date   HGBA1C 5.1 05/16/2021   Last thyroid functions Lab Results  Component Value Date   TSH 1.540 05/16/2021   Last vitamin D Lab Results  Component Value Date   VD25OH 27.6 (L) 05/16/2021       Objective     Today's Vitals   06/27/21 0855  BP: 117/64  Pulse: 60  Temp: 98.7 F (37.1 C)  SpO2: 98%  Weight: 132 lb 6.4 oz (60.1 kg)  Height: 5' 7"  (1.702 m)   Body mass index is 20.74 kg/m.   BP Readings from Last 3 Encounters:  06/27/21 117/64  06/10/21 109/62  05/16/21 133/75    Wt Readings from Last 3 Encounters:  06/27/21 132 lb 6.4 oz (60.1 kg)  06/10/21 133 lb (60.3 kg)  05/16/21 132 lb 1.6 oz (59.9 kg)    Physical Exam Vitals and nursing note reviewed. Exam conducted with a chaperone present.  Constitutional:      Appearance: Normal appearance. She is well-developed.  HENT:      Head: Normocephalic and atraumatic.     Right Ear: Tympanic membrane, ear canal and external ear normal.     Left Ear: Tympanic membrane, ear canal and external ear normal.     Nose: Nose normal.     Mouth/Throat:     Mouth: Mucous membranes are moist.     Pharynx: Oropharynx is clear.  Eyes:     Extraocular Movements: Extraocular movements intact.     Conjunctiva/sclera: Conjunctivae normal.     Pupils: Pupils are equal, round, and reactive to light.  Cardiovascular:     Rate and Rhythm: Normal rate and regular rhythm.     Pulses: Normal pulses.     Heart sounds: Normal heart sounds.  Pulmonary:     Effort: Pulmonary effort is normal.     Breath sounds: Normal breath sounds.  Chest:  Breasts:    Right: Normal. No swelling, bleeding, inverted nipple, mass, nipple discharge or tenderness.     Left: Normal. No swelling, inverted nipple, mass, nipple discharge, skin change or tenderness.  Abdominal:     General: Bowel sounds are normal. There is no distension.     Palpations: Abdomen is soft. There is no mass.     Tenderness: There is no abdominal tenderness. There is no guarding or rebound.     Hernia: No hernia is present. There is no hernia in the left inguinal area or right inguinal area.  Genitourinary:    General: Normal vulva.     Exam position: Supine.     Labia:        Right: No rash, tenderness or lesion.        Left: No rash, tenderness or lesion.      Vagina: Normal. No signs of injury and foreign body. No erythema, tenderness or bleeding.     Cervix: No cervical motion tenderness, discharge, friability, lesion or erythema.     Uterus: Not deviated, not enlarged, not fixed and not tender.      Adnexa: Right adnexa normal and left adnexa normal.     Comments: No tenderness, masses, or organomeglay present during bimanual exam .   Musculoskeletal:        General: Normal range of motion.     Cervical back: Normal range of motion and neck supple.  Lymphadenopathy:      Cervical: No cervical adenopathy.     Upper Body:     Right upper body: No axillary adenopathy.     Left upper body: No axillary adenopathy.     Lower Body: No right inguinal adenopathy. No left inguinal adenopathy.  Skin:    General: Skin is warm and dry.     Capillary Refill: Capillary refill takes less than 2 seconds.  Neurological:     General: No focal deficit present.  Mental Status: She is alert and oriented to person, place, and time.     Cranial Nerves: Cranial nerve deficit present.     Sensory: No sensory deficit.     Motor: No weakness.     Coordination: Coordination normal.     Gait: Gait normal.     Deep Tendon Reflexes: Reflexes normal.     Comments: Facial weakness which incorporates the left eyebrow, the left cheek, left side of the mouth.  Facial movements asymmetrical. Pupils are equal round and reactive to light and accommodation bilaterally.   Psychiatric:        Mood and Affect: Mood normal.        Behavior: Behavior normal.        Thought Content: Thought content normal.        Judgment: Judgment normal.     Results for orders placed or performed in visit on 06/27/21  CBC with Differential/Platelet  Result Value Ref Range   WBC 10.0 3.4 - 10.8 x10E3/uL   RBC 4.45 3.77 - 5.28 x10E6/uL   Hemoglobin 12.7 11.1 - 15.9 g/dL   Hematocrit 38.3 34.0 - 46.6 %   MCV 86 79 - 97 fL   MCH 28.5 26.6 - 33.0 pg   MCHC 33.2 31.5 - 35.7 g/dL   RDW 12.8 11.7 - 15.4 %   Platelets 455 (H) 150 - 450 x10E3/uL   Neutrophils 64 Not Estab. %   Lymphs 27 Not Estab. %   Monocytes 7 Not Estab. %   Eos 1 Not Estab. %   Basos 1 Not Estab. %   Neutrophils Absolute 6.4 1.4 - 7.0 x10E3/uL   Lymphocytes Absolute 2.7 0.7 - 3.1 x10E3/uL   Monocytes Absolute 0.7 0.1 - 0.9 x10E3/uL   EOS (ABSOLUTE) 0.1 0.0 - 0.4 x10E3/uL   Basophils Absolute 0.1 0.0 - 0.2 x10E3/uL   Immature Granulocytes 0 Not Estab. %   Immature Grans (Abs) 0.0 0.0 - 0.1 x10E3/uL  Cytology - PAP( [facility])  Result Value Ref Range   High risk HPV Negative    Adequacy      Satisfactory for evaluation; transformation zone component ABSENT.   Diagnosis      - Negative for intraepithelial lesion or malignancy (NILM)   Comment Normal Reference Range HPV - Negative     Assessment & Plan    1. Well woman exam Annual wellness visit today.  Pap smear obtained during today's visit. - Cytology - PAP( [place])  2. Bell's palsy Gradually improving.  Reviewed MRI done on 05/30/2021.  Results unremarkable.  Reviewed consultation notes from neurology supporting diagnosis of Bell's palsy.  Recovery should occur between 3 and 6 months.  3. Vitamin D deficiency Continue treatment with Drisdol 50,000 international units weekly.  4. Leukocytosis, unspecified type Recheck CBC. - CBC with Differential/Platelet; Future - CBC with Differential/Platelet  5. Encounter for screening mammogram for malignant neoplasm of breast Order placed for screening mammogram. - MM DIGITAL SCREENING BILATERAL; Future   Problem List Items Addressed This Visit       Nervous and Auditory   Bell's palsy     Other   Vitamin D deficiency   Leukocytosis   Relevant Orders   CBC with Differential/Platelet (Completed)   Other Visit Diagnoses     Well woman exam    -  Primary   Relevant Orders   Cytology - PAP( [place]) (Completed)   Encounter for screening mammogram for malignant neoplasm of breast  Relevant Orders   MM DIGITAL SCREENING BILATERAL        Return in about 1 year (around 06/28/2022) for health maintenance exam, FBW a week prior to visit.         Ronnell Freshwater, NP  [facility] [phone] (phone) [phone] (fax)  [place]

## 2021-06-28 LAB — CBC WITH DIFFERENTIAL/PLATELET
Basophils Absolute: 0.1 10*3/uL (ref 0.0–0.2)
Basos: 1 %
EOS (ABSOLUTE): 0.1 10*3/uL (ref 0.0–0.4)
Eos: 1 %
Hematocrit: 38.3 % (ref 34.0–46.6)
Hemoglobin: 12.7 g/dL (ref 11.1–15.9)
Immature Grans (Abs): 0 10*3/uL (ref 0.0–0.1)
Immature Granulocytes: 0 %
Lymphocytes Absolute: 2.7 10*3/uL (ref 0.7–3.1)
Lymphs: 27 %
MCH: 28.5 pg (ref 26.6–33.0)
MCHC: 33.2 g/dL (ref 31.5–35.7)
MCV: 86 fL (ref 79–97)
Monocytes Absolute: 0.7 10*3/uL (ref 0.1–0.9)
Monocytes: 7 %
Neutrophils Absolute: 6.4 10*3/uL (ref 1.4–7.0)
Neutrophils: 64 %
Platelets: 455 10*3/uL — ABNORMAL HIGH (ref 150–450)
RBC: 4.45 x10E6/uL (ref 3.77–5.28)
RDW: 12.8 % (ref 11.7–15.4)
WBC: 10 10*3/uL (ref 3.4–10.8)

## 2021-06-28 LAB — CYTOLOGY - PAP
Adequacy: ABSENT
Comment: NEGATIVE
Diagnosis: NEGATIVE
High risk HPV: NEGATIVE

## 2021-07-01 NOTE — Progress Notes (Signed)
Please let the patient know that she had a normal pap smear. Also, white blood cell count returned to normal. This time, her platelet count was up just a little. This is something we can check again at hier next visit.  ?Thanks so much.   -HB

## 2021-07-06 DIAGNOSIS — D72829 Elevated white blood cell count, unspecified: Secondary | ICD-10-CM | POA: Insufficient documentation

## 2021-12-17 ENCOUNTER — Encounter: Payer: PRIVATE HEALTH INSURANCE | Admitting: Psychology

## 2022-07-16 ENCOUNTER — Telehealth: Payer: Self-pay

## 2022-07-16 NOTE — Telephone Encounter (Signed)
Pt came by yesterday afternoon to drop off FMLA paperwork for Heather to complete.  Pt paid the 29$ paperwork fee 07/15/22  Pt was placed in provider box.

## 2022-07-16 NOTE — Telephone Encounter (Signed)
Paperwork was placed  in the provider basket

## 2022-11-12 ENCOUNTER — Other Ambulatory Visit: Payer: Self-pay

## 2022-11-12 DIAGNOSIS — Z0000 Encounter for general adult medical examination without abnormal findings: Secondary | ICD-10-CM

## 2022-11-12 DIAGNOSIS — Z130 Encounter for screening for diseases of the blood and blood-forming organs and certain disorders involving the immune mechanism: Secondary | ICD-10-CM

## 2022-11-19 ENCOUNTER — Other Ambulatory Visit: Payer: PRIVATE HEALTH INSURANCE

## 2022-11-19 DIAGNOSIS — Z130 Encounter for screening for diseases of the blood and blood-forming organs and certain disorders involving the immune mechanism: Secondary | ICD-10-CM

## 2022-11-19 DIAGNOSIS — Z0000 Encounter for general adult medical examination without abnormal findings: Secondary | ICD-10-CM

## 2022-11-20 LAB — COMPREHENSIVE METABOLIC PANEL
ALT: 11 IU/L (ref 0–32)
AST: 18 IU/L (ref 0–40)
Albumin: 4.2 g/dL (ref 3.9–4.9)
Alkaline Phosphatase: 57 IU/L (ref 44–121)
BUN: 9 mg/dL (ref 6–24)
CO2: 20 mmol/L (ref 20–29)
Calcium: 9.3 mg/dL (ref 8.7–10.2)
Creatinine, Ser: 0.85 mg/dL (ref 0.57–1.00)
Glucose: 89 mg/dL (ref 70–99)
Potassium: 4.1 mmol/L (ref 3.5–5.2)
Sodium: 137 mmol/L (ref 134–144)
Total Protein: 6.1 g/dL (ref 6.0–8.5)

## 2022-11-20 LAB — CBC WITH DIFFERENTIAL/PLATELET
Basos: 0 %
EOS (ABSOLUTE): 0.1 10*3/uL (ref 0.0–0.4)
Eos: 2 %
Hematocrit: 40 % (ref 34.0–46.6)
Immature Grans (Abs): 0 10*3/uL (ref 0.0–0.1)
Immature Granulocytes: 0 %
Lymphocytes Absolute: 2.8 10*3/uL (ref 0.7–3.1)
Lymphs: 50 %
MCH: 28.6 pg (ref 26.6–33.0)
MCHC: 32.5 g/dL (ref 31.5–35.7)
MCV: 88 fL (ref 79–97)
Monocytes Absolute: 0.5 10*3/uL (ref 0.1–0.9)
RBC: 4.55 x10E6/uL (ref 3.77–5.28)
RDW: 12.6 % (ref 11.7–15.4)

## 2022-11-20 LAB — LIPID PANEL
Chol/HDL Ratio: 1.5 ratio (ref 0.0–4.4)
HDL: 94 mg/dL (ref >39)
LDL Chol Calc (NIH): 39 mg/dL (ref 0–99)
Triglycerides: 43 mg/dL (ref 0–149)
VLDL Cholesterol Cal: 10 mg/dL (ref 5–40)

## 2022-11-20 LAB — TSH: TSH: 1.44 u[IU]/mL (ref 0.450–4.500)

## 2022-11-20 LAB — HEMOGLOBIN A1C
Est. average glucose Bld gHb Est-mCnc: 100 mg/dL
Hgb A1c MFr Bld: 5.1 % (ref 4.8–5.6)

## 2022-11-26 ENCOUNTER — Encounter: Payer: Self-pay | Admitting: Family Medicine

## 2022-11-26 ENCOUNTER — Ambulatory Visit (INDEPENDENT_AMBULATORY_CARE_PROVIDER_SITE_OTHER): Payer: PRIVATE HEALTH INSURANCE | Admitting: Family Medicine

## 2022-11-26 VITALS — BP 113/71 | HR 65 | Ht 67.0 in | Wt 124.4 lb

## 2022-11-26 DIAGNOSIS — Z1239 Encounter for other screening for malignant neoplasm of breast: Secondary | ICD-10-CM

## 2022-11-26 DIAGNOSIS — Z1211 Encounter for screening for malignant neoplasm of colon: Secondary | ICD-10-CM

## 2022-11-26 DIAGNOSIS — F419 Anxiety disorder, unspecified: Secondary | ICD-10-CM

## 2022-11-26 DIAGNOSIS — Z1212 Encounter for screening for malignant neoplasm of rectum: Secondary | ICD-10-CM

## 2022-11-26 DIAGNOSIS — F17200 Nicotine dependence, unspecified, uncomplicated: Secondary | ICD-10-CM | POA: Diagnosis not present

## 2022-11-26 DIAGNOSIS — F32A Depression, unspecified: Secondary | ICD-10-CM

## 2022-11-26 MED ORDER — NICOTINE POLACRILEX 2 MG MT GUM: 2.0000 mg | CHEWING_GUM | OROMUCOSAL | 0 refills | Status: AC | PRN

## 2022-11-26 MED ORDER — BUPROPION HCL ER (SR) 150 MG PO TB12: 150.0000 mg | ORAL_TABLET | Freq: Two times a day (BID) | ORAL | 2 refills | Status: AC

## 2022-11-26 NOTE — Progress Notes (Unsigned)
   Established Patient Office Visit  Subjective   Patient ID: Savannah Peters, female    DOB: November 28, 1975  Age: 47 y.o. MRN: [id number]  Chief Complaint  Patient presents with   Annual Exam    HPI Physical -  Patient recently celebrated her birthday by going to print works bistro with her family.  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  Patient is single.  Has no children.  Patient willing to do Cologuard for colorectal cancer screening.  Has no questions about it  We discussed the patient's lab work which was all normal.  Tobacco use-patient smokes a pack per day.  Has a desire to quit.  Usually smokes after work.  Discussed Wellbutrin and nicotine replacement combination  Stress-patient feeling stressed out with having to work full-time and take care of her parents who are both sick.  Discussed how Wellbutrin can also help with anxiety and depression as well.  Patient had Bell's palsy affecting the left side of her face in the last year.  Feels like it is improving.  We discussed mammograms.  Patient wants an ultrasound but understands that insurance will pay for that prior to a mammogram.  She is willing to get a mammogram   The 10-year ASCVD risk score (Arnett DK, et al., 2019) is: 0.3%   {History (Optional):23778}  ROS    Objective:     BP 113/71   Pulse 65   Ht 5\' 7"  (1.702 m)   Wt 124 lb 6.4 oz (56.4 kg)   LMP 10/31/2022   BMI 19.48 kg/m  {Vitals History (Optional):23777}  Physical Exam General: Alert and oriented HEENT: Mild left-sided facial weakness.:  PERRLA, EOMI CV: Regular rhythm Pulmonary: Lungs clear bilaterally GI: Soft, tender Extremities: No pedal edema MSK: Strength equal bilaterally Psych: Pleasant affect    No results found for any visits on 11/26/22.  {Labs (Optional):23779}      Assessment & Plan:   Breast screening -     3D Screening Mammogram, Left and Right; Future  Encounter for colorectal cancer  screening -     Cologuard  Smoking Assessment & Plan: Bupropion SR 150 twice daily, start with once a day for 3 days Nicotine gum as needed Follow-up in 1 month   Anxiety and depression Assessment & Plan: Will start bupropion for anxiety/depression as well as smoking cessation - Follow-up in 1 month   Other orders -     buPROPion HCl ER (SR); Take 1 tablet (150 mg total) by mouth 2 (two) times daily.  Dispense: 60 tablet; Refill: 2 -     Nicotine Polacrilex; Take 1 each (2 mg total) by mouth as needed for smoking cessation.  Dispense: 100 tablet; Refill: 0     Return in about 4 weeks (around 12/24/2022) for Smoking, mood.    Sandre Kitty, MD

## 2022-11-26 NOTE — Patient Instructions (Addendum)
It was nice to see you today,  We addressed the following topics today: - I have started a medication called Wellbutrin that you can take to help quit smoking but can also help with your mood - Start by taking it once a day for 3 days and then start taking it twice a day after that - I would like to see you back in 1 month - I will put in a referral for mammogram - We will send in the Cologuard order.  This will get mailed to you.  Have a great day,  Frederic Jericho, MD

## 2022-11-26 NOTE — Assessment & Plan Note (Signed)
Will start bupropion for anxiety/depression as well as smoking cessation - Follow-up in 1 month

## 2022-11-26 NOTE — Assessment & Plan Note (Signed)
Bupropion SR 150 twice daily, start with once a day for 3 days Nicotine gum as needed Follow-up in 1 month

## 2022-12-22 ENCOUNTER — Ambulatory Visit: Payer: PRIVATE HEALTH INSURANCE | Admitting: Family Medicine

## 2023-01-13 ENCOUNTER — Other Ambulatory Visit: Payer: PRIVATE HEALTH INSURANCE

## 2023-01-19 ENCOUNTER — Ambulatory Visit: Payer: PRIVATE HEALTH INSURANCE | Admitting: Family Medicine

## 2023-01-20 ENCOUNTER — Encounter: Payer: PRIVATE HEALTH INSURANCE | Admitting: Nurse Practitioner

## 2023-02-04 IMAGING — MR MR HEAD W/O CM
10 of 11 series · 42 of 48 positions shown · non-contrast
Comparison: None.

CLINICAL DATA: Left facial paresis for 2-3 weeks

EXAM:
MRI HEAD WITHOUT CONTRAST
TECHNIQUE: Multiplanar, multiecho pulse sequences of the brain and surrounding
structures were obtained without intravenous contrast.

[Series 5: T1 · sagittal · 4.0mm · 0.72mm/px · 3 of 30 slices shown (1 of 3)]
[im 1/30]
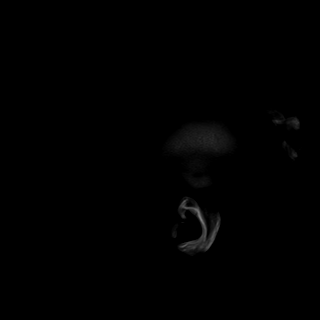
[im 15/30]
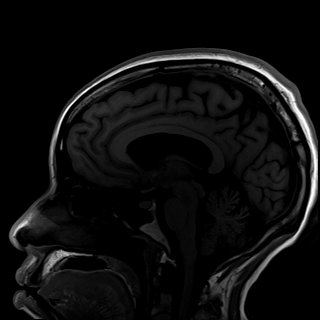
[im 30/30]
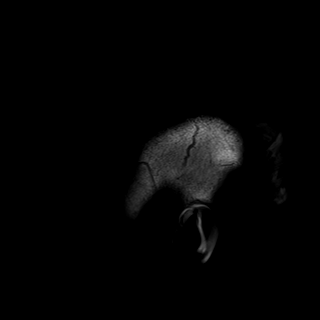

[Series 6: DWI · axial · 3.0mm · 0.94mm/px · z∈[-64,+87]mm · 14 of 171 slices shown]
[im 1/171]
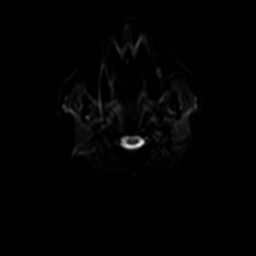
[im 14/171]
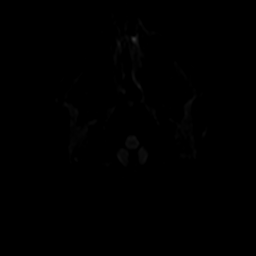
[im 27/171]
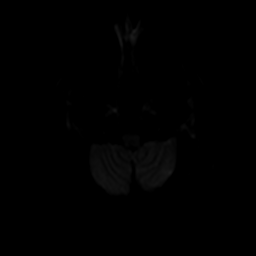
[im 40/171]
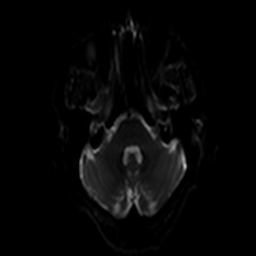
[im 53/171]
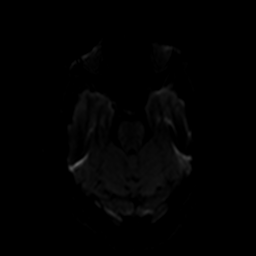
[im 66/171]
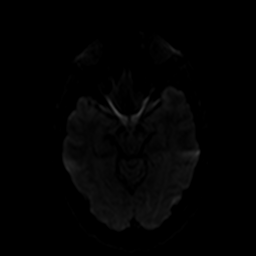
[im 79/171]
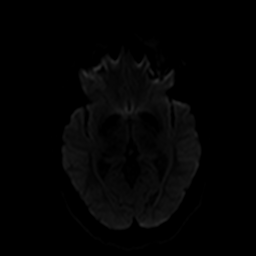
[im 92/171]
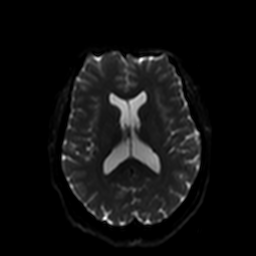
[im 105/171]
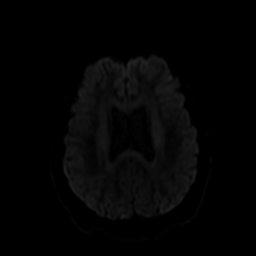
[im 118/171]
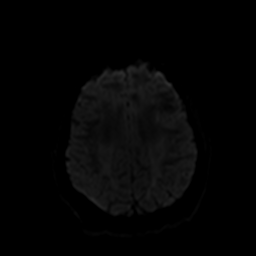
[im 131/171]
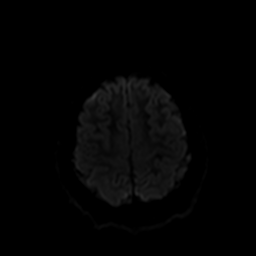
[im 144/171]
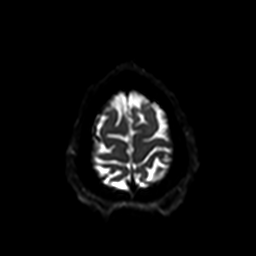
[im 157/171]
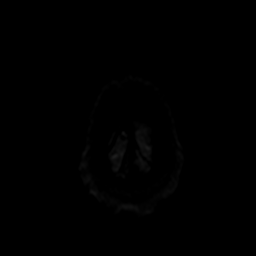
[im 171/171]
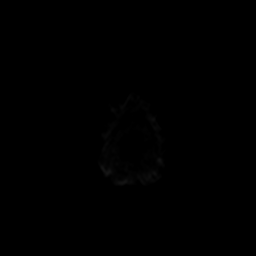

[Series 7: ax dwi_tracew · axial · 3.0mm · 0.94mm/px · z∈[-64,+87]mm · 7 of 85 slices shown]
[im 1/85]
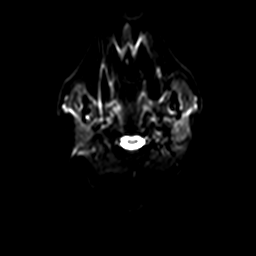
[im 15/85]
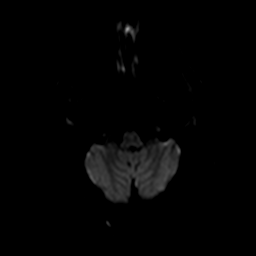
[im 29/85]
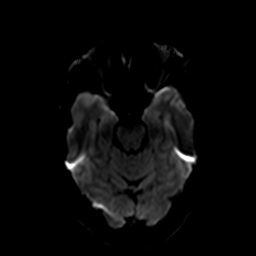
[im 43/85]
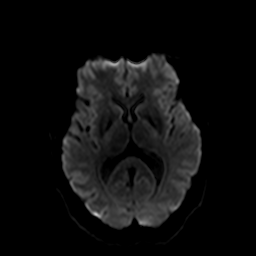
[im 57/85]
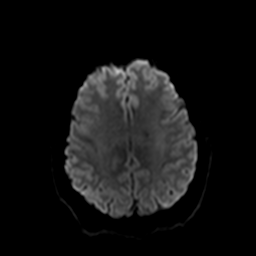
[im 71/85]
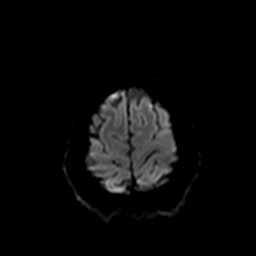
[im 85/85]
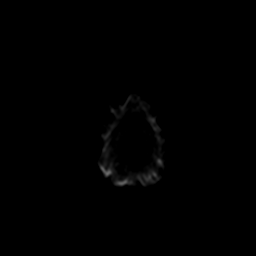

[Series 8: ax dwi_adc · axial · 3.0mm · 0.94mm/px · z∈[-64,+87]mm · 4 of 43 slices shown]
[im 1/43]
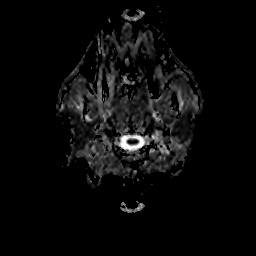
[im 15/43]
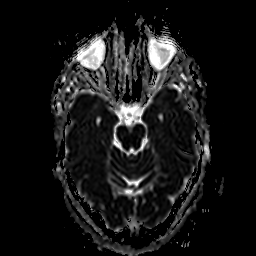
[im 29/43]
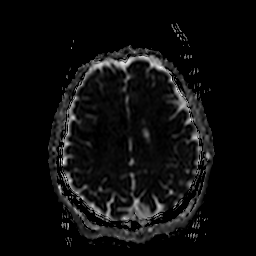
[im 43/43]
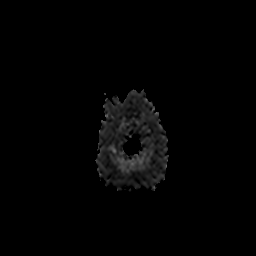

[Series 9: T2 · axial · 4.0mm · 0.36mm/px · z∈[-69,+87]mm · 3 of 31 slices shown]
[im 1/31]
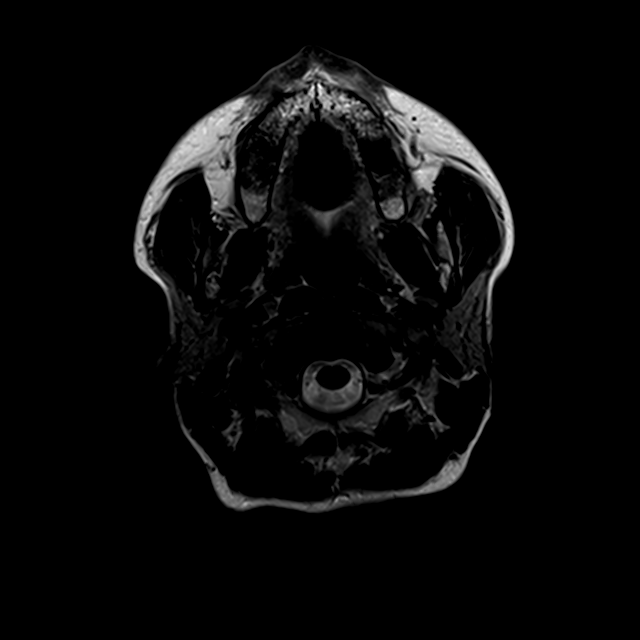
[im 16/31]
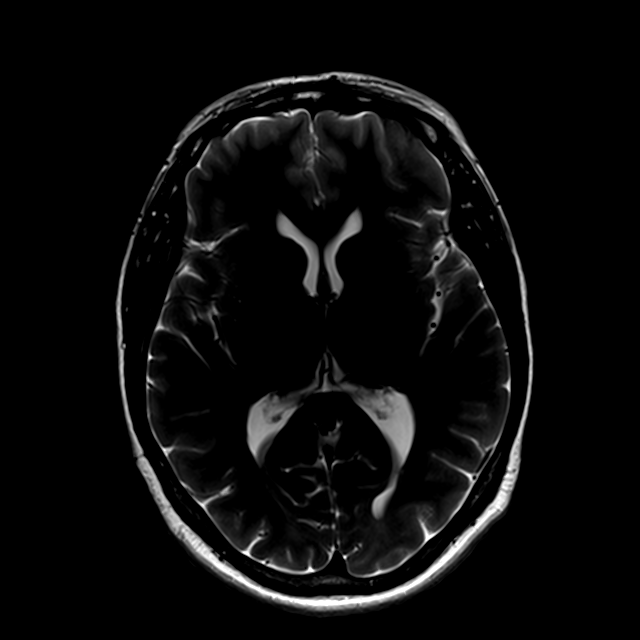
[im 31/31]
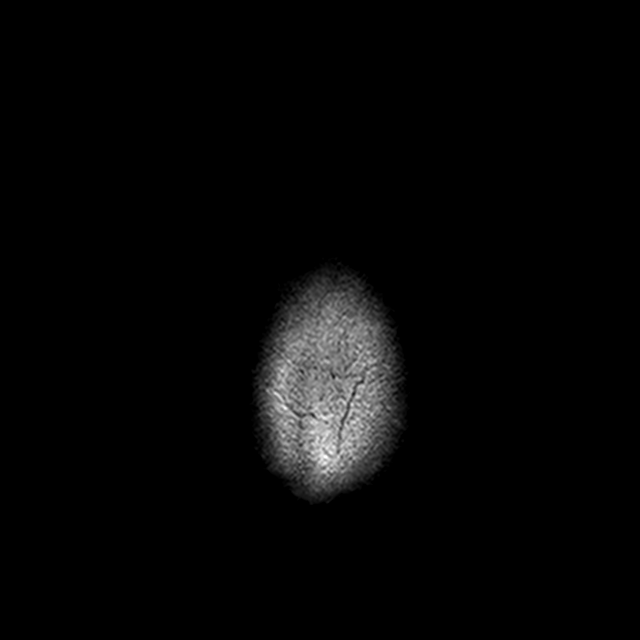

[Series 10: FLAIR · axial · 3.0mm · 0.72mm/px · z∈[-72,+90]mm · 2 of 28 slices shown]
[im 1/28]
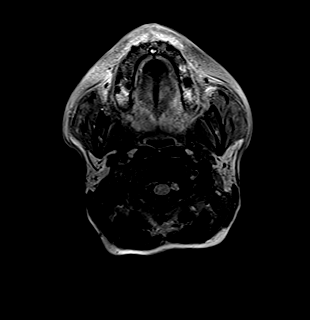
[im 28/28]
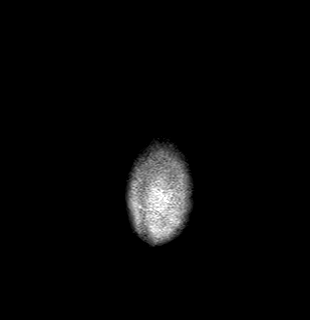

[Series 11: T1 · coronal · 3.0mm · 0.56mm/px · 1 of 13 slices shown (2 of 3)]
[im 1/13]
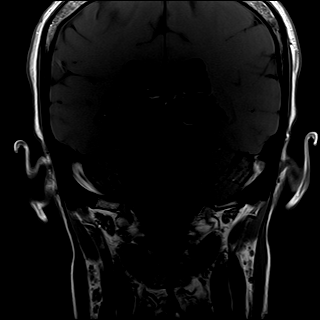

[Series 12: mip_images(sw) · axial · 24.0mm · 0.90mm/px · z∈[-62,+58]mm · 3 of 41 slices shown]
[im 1/41]
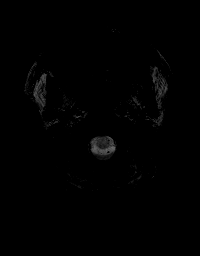
[im 21/41]
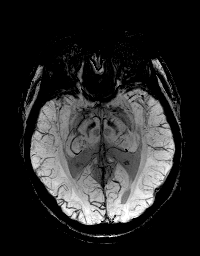
[im 41/41]
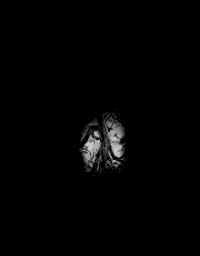

[Series 13: swi_images · axial · 3.0mm · 0.90mm/px · z∈[-73,+68]mm · 4 of 48 slices shown]
[im 1/48]
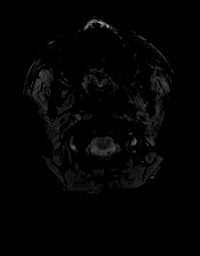
[im 16/48]
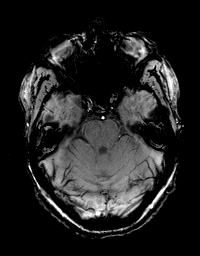
[im 32/48]
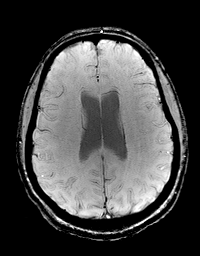
[im 48/48]
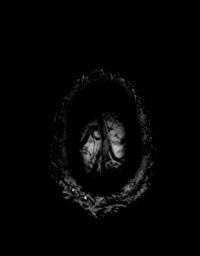

[Series 14: T1 · axial · 3.0mm · 0.50mm/px · 1 of 13 slices shown (3 of 3)]
[im 1/13]
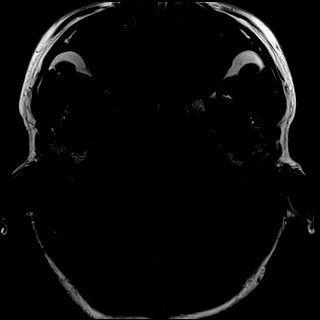

[42 of 48 positions shown; findings below may reference images not displayed]

FINDINGS: Brain: There is no evidence of acute intracranial hemorrhage,
extra-axial fluid collection, or acute infarct.

Parenchymal volume is normal. The ventricles are normal in size.
Parenchyma is normal in appearance, with no signal abnormality.

There is no mass lesion.  There is no midline shift.

Dedicated high-resolution T2 images through the internal auditory
canals are normal. The seventh and eighth cranial nerves are normal
bilaterally. The inner ear structures are normal.

Vascular: Normal flow voids.

Skull and upper cervical spine: Normal marrow signal.

Sinuses/Orbits: There is a small retention cyst in the left sphenoid
sinus. The paranasal sinuses are otherwise clear. The globes and
orbits are unremarkable.

Other: None.
IMPRESSION: Unremarkable brain MRI. Normal noncontrast appearance of the IAC's
and 7th/8th cranial nerves. Postcontrast imaging may be considered
to evaluate for facial nerve enhancement given history of facial
palsy, if indicated.

## 2023-02-25 ENCOUNTER — Ambulatory Visit: Payer: PRIVATE HEALTH INSURANCE | Admitting: Family Medicine

## 2023-03-02 ENCOUNTER — Ambulatory Visit: Payer: PRIVATE HEALTH INSURANCE | Admitting: Family Medicine

## 2023-11-04 LAB — HM MAMMOGRAPHY
